# Patient Record
Sex: Female | Born: 1975 | Race: Black or African American | Hispanic: No | Marital: Married | Smoking: Never smoker
Health system: Southern US, Community
[De-identification: ages and names within clinical notes are randomized; demographics above are authoritative.]

## PROBLEM LIST (undated history)

## (undated) ENCOUNTER — Inpatient Hospital Stay (HOSPITAL_COMMUNITY): Payer: Self-pay

## (undated) DIAGNOSIS — D759 Disease of blood and blood-forming organs, unspecified: Secondary | ICD-10-CM

## (undated) DIAGNOSIS — D65 Disseminated intravascular coagulation [defibrination syndrome]: Secondary | ICD-10-CM

## (undated) DIAGNOSIS — Z789 Other specified health status: Secondary | ICD-10-CM

## (undated) DIAGNOSIS — G510 Bell's palsy: Secondary | ICD-10-CM

## (undated) HISTORY — PX: TONSILLECTOMY: SUR1361

## (undated) HISTORY — PX: DILATION AND CURETTAGE OF UTERUS: SHX78

## (undated) HISTORY — PX: WISDOM TOOTH EXTRACTION: SHX21

---

## 1998-11-21 ENCOUNTER — Ambulatory Visit (HOSPITAL_COMMUNITY): Admission: RE | Admit: 1998-11-21 | Discharge: 1998-11-21 | Payer: Self-pay | Admitting: Obstetrics

## 1999-01-02 ENCOUNTER — Inpatient Hospital Stay (HOSPITAL_COMMUNITY): Admission: RE | Admit: 1999-01-02 | Discharge: 1999-01-02 | Payer: Self-pay | Admitting: Obstetrics

## 1999-01-02 ENCOUNTER — Encounter: Payer: Self-pay | Admitting: Obstetrics

## 1999-02-27 ENCOUNTER — Inpatient Hospital Stay (HOSPITAL_COMMUNITY): Admission: AD | Admit: 1999-02-27 | Discharge: 1999-02-27 | Payer: Self-pay | Admitting: Obstetrics

## 1999-05-09 ENCOUNTER — Inpatient Hospital Stay (HOSPITAL_COMMUNITY): Admission: AD | Admit: 1999-05-09 | Discharge: 1999-05-13 | Payer: Self-pay | Admitting: Obstetrics

## 2001-06-04 ENCOUNTER — Inpatient Hospital Stay (HOSPITAL_COMMUNITY): Admission: AD | Admit: 2001-06-04 | Discharge: 2001-06-04 | Payer: Self-pay | Admitting: Obstetrics and Gynecology

## 2001-06-04 ENCOUNTER — Encounter: Payer: Self-pay | Admitting: Obstetrics and Gynecology

## 2001-11-10 ENCOUNTER — Inpatient Hospital Stay (HOSPITAL_COMMUNITY): Admission: AD | Admit: 2001-11-10 | Discharge: 2001-11-10 | Payer: Self-pay | Admitting: Obstetrics

## 2002-01-28 ENCOUNTER — Inpatient Hospital Stay (HOSPITAL_COMMUNITY): Admission: AD | Admit: 2002-01-28 | Discharge: 2002-01-31 | Payer: Self-pay | Admitting: Obstetrics

## 2002-04-09 ENCOUNTER — Encounter: Admission: RE | Admit: 2002-04-09 | Discharge: 2002-05-09 | Payer: Self-pay | Admitting: Obstetrics

## 2012-07-22 ENCOUNTER — Other Ambulatory Visit: Payer: Self-pay | Admitting: Gastroenterology

## 2012-07-22 DIAGNOSIS — R1310 Dysphagia, unspecified: Secondary | ICD-10-CM

## 2012-07-30 ENCOUNTER — Ambulatory Visit
Admission: RE | Admit: 2012-07-30 | Discharge: 2012-07-30 | Disposition: A | Discharge status: Home / Self Care | Payer: 59 | Source: Ambulatory Visit | Attending: Gastroenterology | Admitting: Gastroenterology

## 2012-07-30 DIAGNOSIS — R1310 Dysphagia, unspecified: Secondary | ICD-10-CM

## 2012-07-30 IMAGING — RF DG ESOPHAGUS
15 of 17 series · 20 of 23 positions shown · non-contrast
Comparison: None.

CLINICAL DATA: Difficulty swallowing pills.

ESOPHOGRAM/BARIUM SWALLOW
TECHNIQUE: Combined double contrast and single contrast
examination performed using effervescent crystals, thick barium
liquid, and thin barium liquid.
Fluoroscopy time:  1.9 minutes.

[Series 1: run · 3 of 4 slices shown (1 of 15)]
[im 1/4]
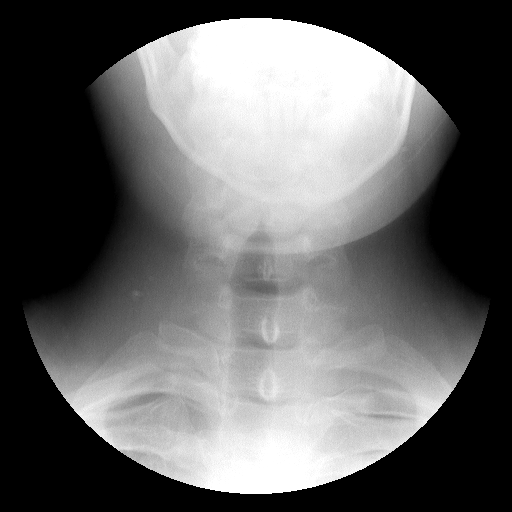
[im 2/4]
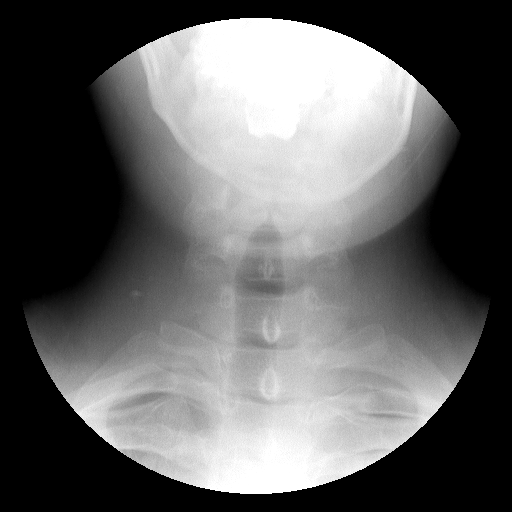
[im 3/4]
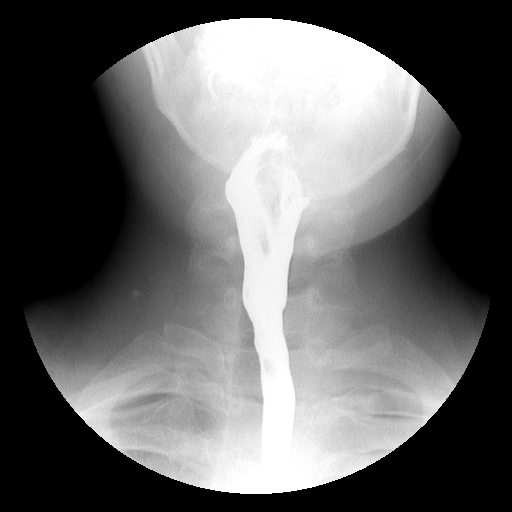

[Series 2: run · 1 of 1 slices shown (2 of 15)]
[im 1/1]
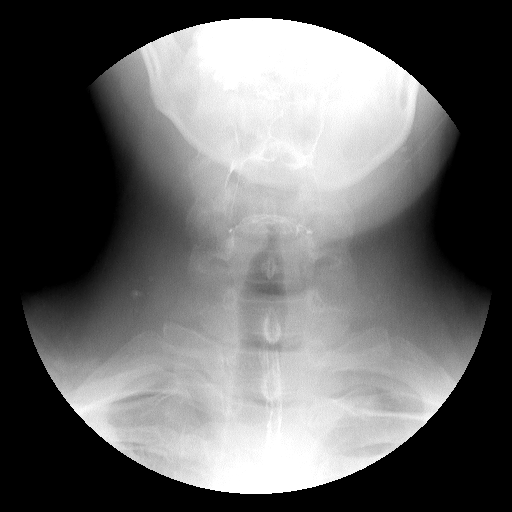

[Series 3: run · 4 of 4 slices shown (3 of 15)]
[im 1/4]
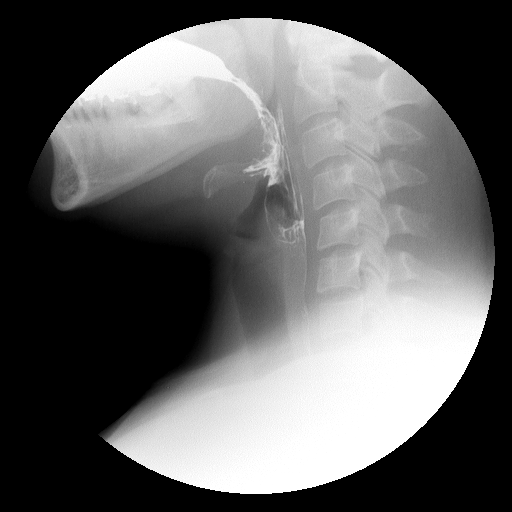
[im 2/4]
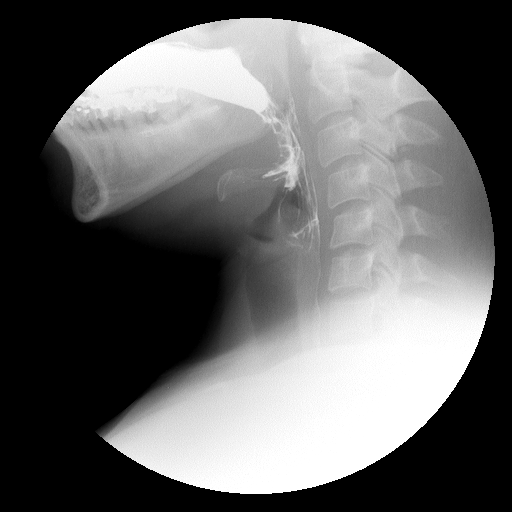
[im 3/4]
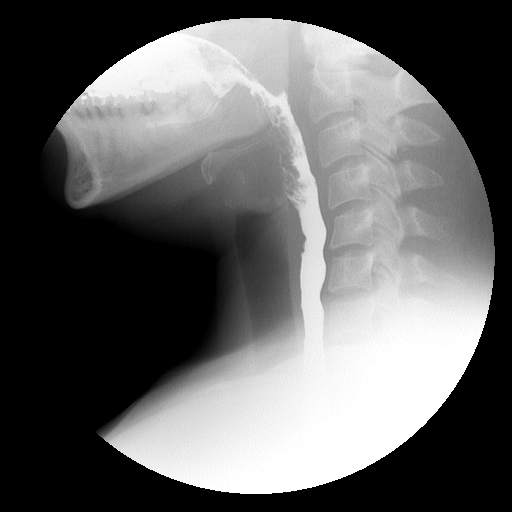
[im 4/4]
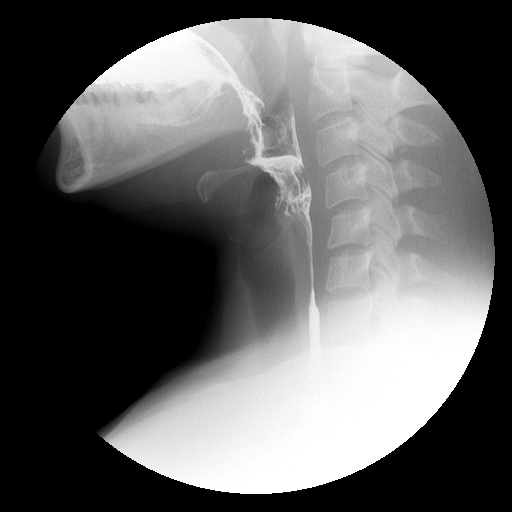

[Series 4: run · 1 of 1 slices shown (4 of 15)]
[im 1/1]
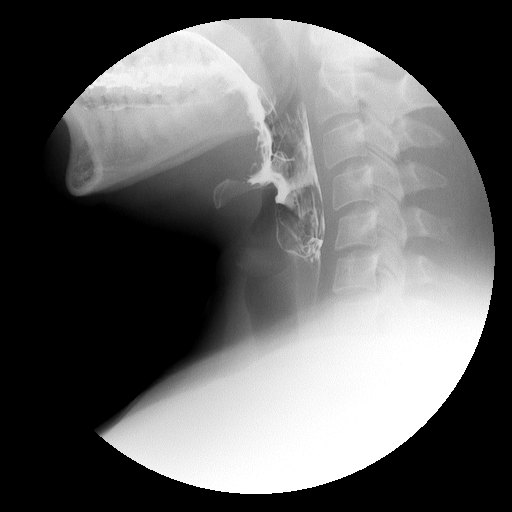

[Series 5: run · 1 of 1 slices shown (5 of 15)]
[im 1/1]
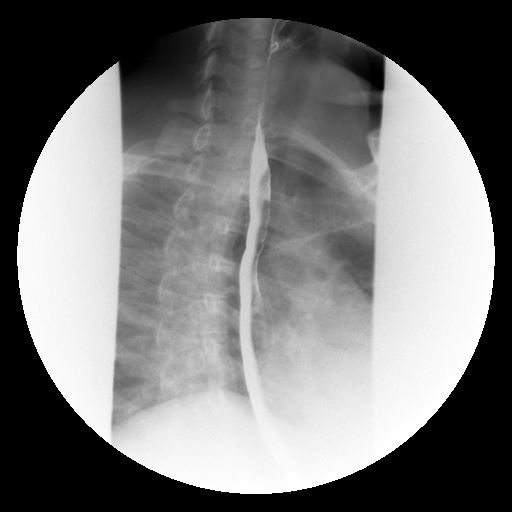

[Series 7: run · 1 of 1 slices shown (6 of 15)]
[im 1/1]
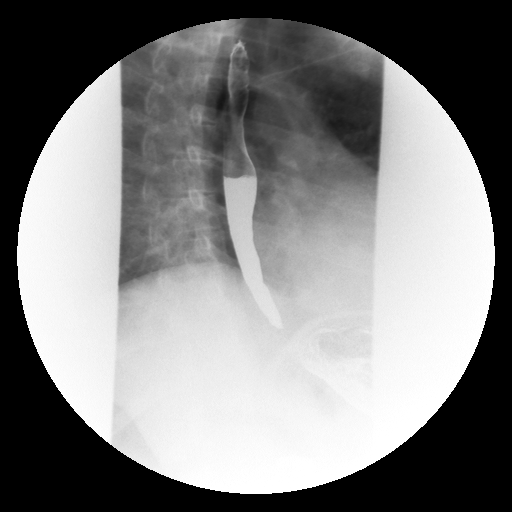

[Series 8: run · 1 of 1 slices shown (7 of 15)]
[im 1/1]
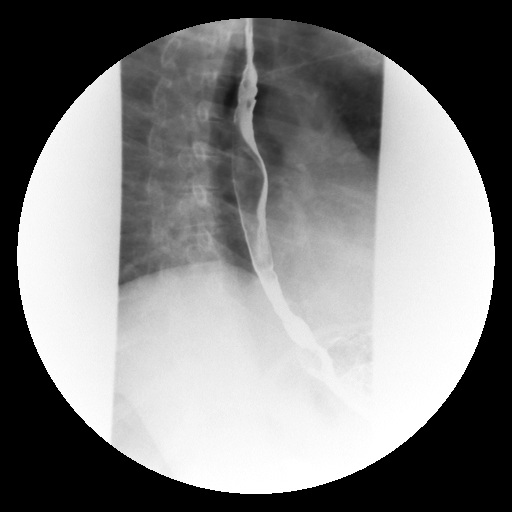

[Series 9: run · 1 of 1 slices shown (8 of 15)]
[im 1/1]
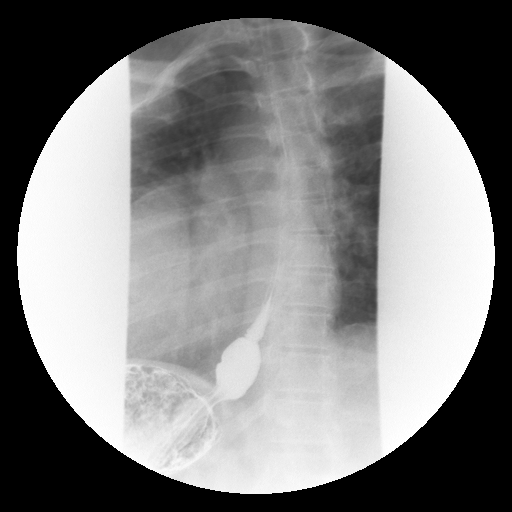

[Series 10: run · 1 of 1 slices shown (9 of 15)]
[im 1/1]
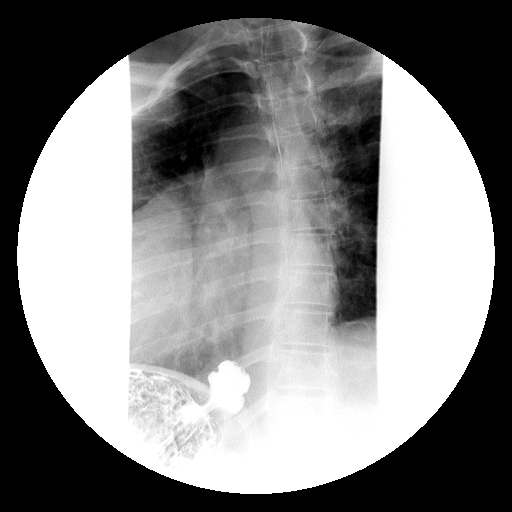

[Series 11: run · 1 of 1 slices shown (10 of 15)]
[im 1/1]
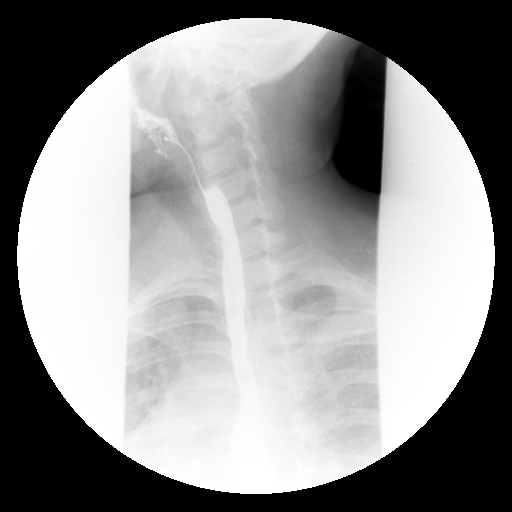

[Series 12: run · 1 of 1 slices shown (11 of 15)]
[im 1/1]
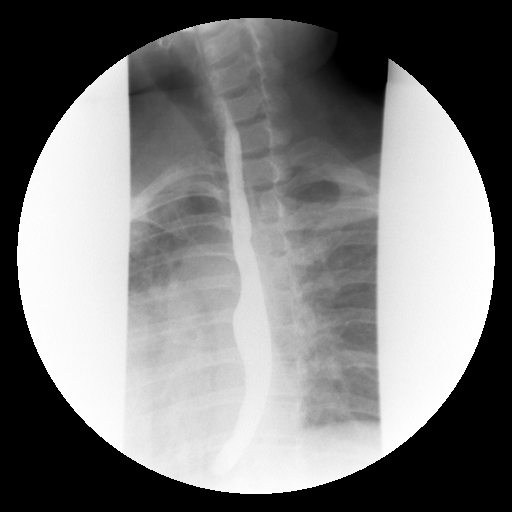

[Series 13: run · 1 of 1 slices shown (12 of 15)]
[im 1/1]
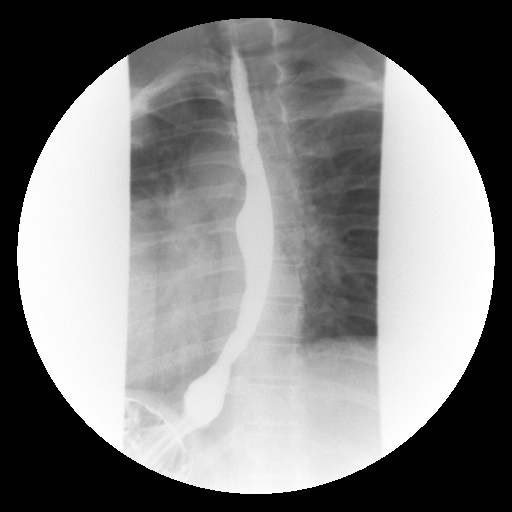

[Series 15: run · 1 of 1 slices shown (13 of 15)]
[im 1/1]
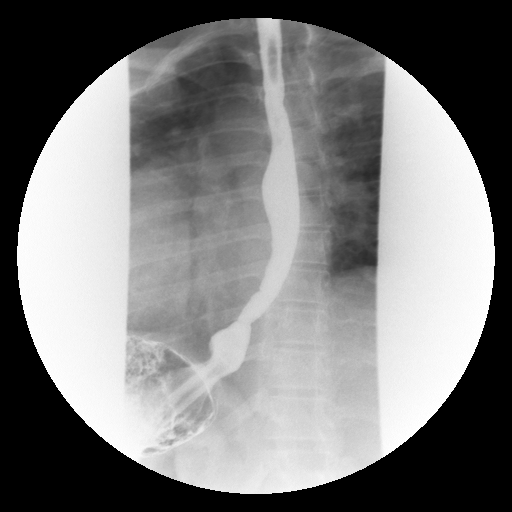

[Series 16: run · 1 of 1 slices shown (14 of 15)]
[im 1/1]
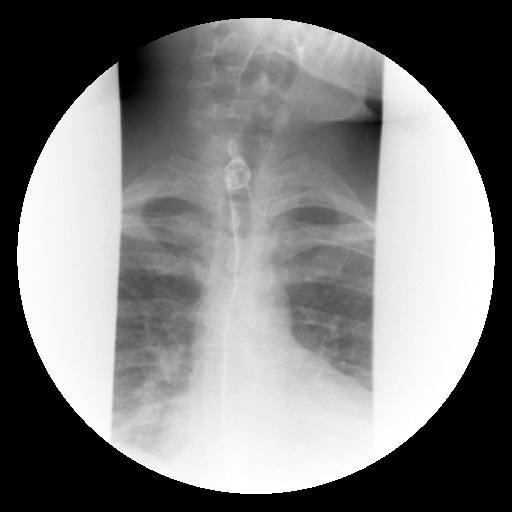

[Series 17: run · 1 of 1 slices shown (15 of 15)]
[im 1/1]
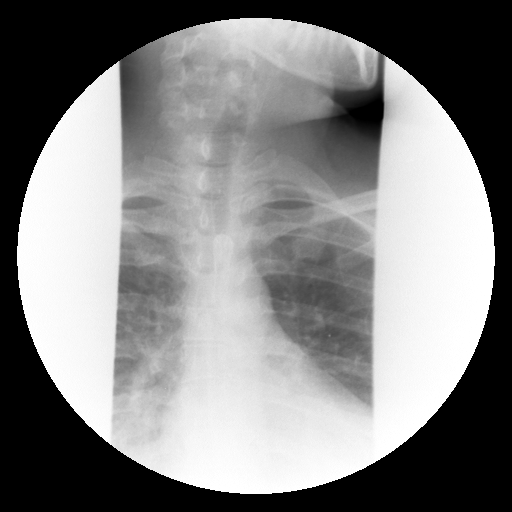

[20 of 23 positions shown; findings below may reference images not displayed]

FINDINGS: Swallowing mechanism is normal.  No discrete area of
stricturing in the esophagus.  However, a 13 mm barium pill would
not pass beyond the upper esophagus.  Esophageal motility is
normal.  Small hiatal hernia.
IMPRESSION: A 13 mm barium pill would not pass beyond the proximal esophagus.
The possibility of subtle short segment narrowing is considered.

## 2012-08-13 ENCOUNTER — Encounter (HOSPITAL_COMMUNITY)
Admission: RE | Disposition: A | Discharge status: Home / Self Care | Payer: Self-pay | Source: Ambulatory Visit | Attending: Gastroenterology

## 2012-08-13 ENCOUNTER — Ambulatory Visit (HOSPITAL_COMMUNITY)
Admission: RE | Admit: 2012-08-13 | Discharge: 2012-08-13 | Disposition: A | Discharge status: Home / Self Care | Payer: 59 | Source: Ambulatory Visit | Attending: Gastroenterology | Admitting: Gastroenterology

## 2012-08-13 ENCOUNTER — Encounter (HOSPITAL_COMMUNITY): Payer: Self-pay | Admitting: *Deleted

## 2012-08-13 DIAGNOSIS — K222 Esophageal obstruction: Secondary | ICD-10-CM | POA: Insufficient documentation

## 2012-08-13 DIAGNOSIS — IMO0002 Reserved for concepts with insufficient information to code with codable children: Secondary | ICD-10-CM | POA: Insufficient documentation

## 2012-08-13 DIAGNOSIS — S1120XA Unspecified open wound of pharynx and cervical esophagus, initial encounter: Secondary | ICD-10-CM | POA: Insufficient documentation

## 2012-08-13 HISTORY — PX: ESOPHAGOGASTRODUODENOSCOPY: SHX5428

## 2012-08-13 HISTORY — PX: SAVORY DILATION: SHX5439

## 2012-08-13 HISTORY — DX: Bell's palsy: G51.0

## 2012-08-13 SURGERY — EGD (ESOPHAGOGASTRODUODENOSCOPY)
Anesthesia: Moderate Sedation

## 2012-08-13 MED ORDER — MIDAZOLAM HCL 10 MG/2ML IJ SOLN
INTRAMUSCULAR | Status: AC
  Filled 2012-08-13: qty 2

## 2012-08-13 MED ORDER — FENTANYL CITRATE 0.05 MG/ML IJ SOLN
INTRAMUSCULAR | Status: DC | PRN
  Administered 2012-08-13: 25 ug via INTRAVENOUS
  Administered 2012-08-13: 12.5 ug via INTRAVENOUS
  Administered 2012-08-13: 25 ug via INTRAVENOUS

## 2012-08-13 MED ORDER — FENTANYL CITRATE 0.05 MG/ML IJ SOLN
INTRAMUSCULAR | Status: AC
  Filled 2012-08-13: qty 2

## 2012-08-13 MED ORDER — MIDAZOLAM HCL 10 MG/2ML IJ SOLN
INTRAMUSCULAR | Status: DC | PRN
  Administered 2012-08-13: 1 mg via INTRAVENOUS
  Administered 2012-08-13 (×2): 2 mg via INTRAVENOUS
  Administered 2012-08-13: 1 mg via INTRAVENOUS
  Administered 2012-08-13: 2 mg via INTRAVENOUS

## 2012-08-13 MED ORDER — BUTAMBEN-TETRACAINE-BENZOCAINE 2-2-14 % EX AERO
INHALATION_SPRAY | CUTANEOUS | Status: DC | PRN
  Administered 2012-08-13: 2 via TOPICAL

## 2012-08-13 MED ORDER — SODIUM CHLORIDE 0.9 % IV SOLN: INTRAVENOUS | Status: DC

## 2012-08-13 MED ORDER — DIPHENHYDRAMINE HCL 50 MG/ML IJ SOLN
INTRAMUSCULAR | Status: AC
  Filled 2012-08-13: qty 1

## 2012-08-13 NOTE — Op Note (Signed)
[facility] [address] [place], [address]   ENDOSCOPY PROCEDURE REPORT  PATIENT: Monica, Yu  MR#: [id number] BIRTHDATE: 14-Jan-1976 , 36  yrs. old GENDER: Female ENDOSCOPIST: Wandalee Ferdinand, MD REFERRED BY: PROCEDURE DATE:  08/13/2012 PROCEDURE:   esophagoscopy ASA CLASS: 1 INDICATIONS: dysphagia, barium swallow showed that a barium tablet lodged in the proximal esophagus suggesting a stricture in this area, there was also a hiatal hernia MEDICATIONS: fentanyl 62.5 mcg IV, Versed 8 mg IV TOPICAL ANESTHETIC:Cetacaine spray  informed consent was obtained after explanation of the risks of bleeding, infection, and perforation.  Procedure: With the patient in the left lateral decubitus position the Pentax gastroscope was inserted into the oropharynx and passed into the esophagus. There was some mild initial resistance to the passage of the scope into the proximal esophagus suggesting that there might have been a esophageal web in this area (see image #1). The scope was then advanced down the esophagus without difficulty to the region of the esophagogastric junction at which point a stricture was seen. The scope could not be advanced past this distal esophageal stricture. (See image #2) I then pulled the scope back a few centimeters and noted that there was a mucosal tear in the distal esophagus a few centimeters above the distal esophageal stricture. (See image #5) this tear occurred simply by advancing the scope down the esophagus without any resistance. The scope was then brought back again into the mid esophagus where there was one episode where I noted concentric rings with contraction of the esophagus suggesting that perhaps the patient has eosinophilic esophagitis. (see image #7) Biopsies were obtained from the esophagus to determine if she had eosinophilic esophagitis. No esophageal dilatation was performed at this time given the possibility of  eosinophilic esophagitis.         DESCRIPTION OF PROCEDURE:   After the risks benefits and alternatives of the procedure were thoroughly explained ,including the risks of bleeding, infection, and perforation, informed consent was obtained.  The [id number] ([id number])  endoscope was introduced through the mouth and advanced to the distal esophagus.      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  #2 distal esophageal stricture which was not traversed during this examination and not dilated. #3 distal esophageal mucosal tear which occurred simply with passage of the endoscope. #4 Concentric esophageal rings noted rule out eosinophilic esophagitis biopsies pending      COMPLICATIONS:distal esophageal mucosal tear occurred simply with passage of the endoscope this appears to be superficial  ENDOSCOPIC IMPRESSION:see above   RECOMMENDATIONS:check biopsies to determine if she has eosinophilic esophagitis and did so this will need to be treated prior to any attempts at esophageal dilatation.      _______________________________ Rhodia Albright, MD 08/13/2012 11:03 AM       PATIENT NAME:  Monica, Yu MR#: [id number]

## 2012-08-13 NOTE — H&P (Signed)
Interval H&P for outpatient endoscopic procedure  The patient is a 36 year old female with intermittent dysphagia to solid foods for the past 4 years. A recent barium swallow with a barium tablet revealed that the barium tablet would not pass out of the proximal esophagus, suggesting a small stricture in this area.  Physical exam:  She is in no acute distress  Heart regular rhythm no murmurs  Lungs clear  Abdomen: Soft and nontender  Impression: Dysphagia, rule out proximal esophageal stricture  Plan: EGD with esophageal dilatation

## 2012-08-14 ENCOUNTER — Encounter (HOSPITAL_COMMUNITY): Payer: Self-pay

## 2012-08-14 ENCOUNTER — Encounter (HOSPITAL_COMMUNITY): Payer: Self-pay | Admitting: Gastroenterology

## 2016-06-02 ENCOUNTER — Inpatient Hospital Stay (HOSPITAL_COMMUNITY)
Admission: AD | Admit: 2016-06-02 | Discharge: 2016-06-02 | Disposition: A | Discharge status: Home / Self Care | Payer: 59 | Source: Ambulatory Visit | Attending: Obstetrics & Gynecology | Admitting: Obstetrics & Gynecology

## 2016-06-02 ENCOUNTER — Encounter (HOSPITAL_COMMUNITY): Payer: Self-pay | Admitting: *Deleted

## 2016-06-02 ENCOUNTER — Inpatient Hospital Stay (HOSPITAL_COMMUNITY): Payer: 59

## 2016-06-02 DIAGNOSIS — R109 Unspecified abdominal pain: Secondary | ICD-10-CM | POA: Insufficient documentation

## 2016-06-02 DIAGNOSIS — O2342 Unspecified infection of urinary tract in pregnancy, second trimester: Secondary | ICD-10-CM

## 2016-06-02 DIAGNOSIS — O34219 Maternal care for unspecified type scar from previous cesarean delivery: Secondary | ICD-10-CM | POA: Diagnosis not present

## 2016-06-02 DIAGNOSIS — O3412 Maternal care for benign tumor of corpus uteri, second trimester: Secondary | ICD-10-CM

## 2016-06-02 DIAGNOSIS — IMO0001 Reserved for inherently not codable concepts without codable children: Secondary | ICD-10-CM

## 2016-06-02 DIAGNOSIS — Z3A21 21 weeks gestation of pregnancy: Secondary | ICD-10-CM | POA: Diagnosis not present

## 2016-06-02 DIAGNOSIS — O26892 Other specified pregnancy related conditions, second trimester: Secondary | ICD-10-CM | POA: Diagnosis not present

## 2016-06-02 DIAGNOSIS — D259 Leiomyoma of uterus, unspecified: Secondary | ICD-10-CM | POA: Diagnosis not present

## 2016-06-02 DIAGNOSIS — O3410 Maternal care for benign tumor of corpus uteri, unspecified trimester: Secondary | ICD-10-CM

## 2016-06-02 HISTORY — DX: Other specified health status: Z78.9

## 2016-06-02 LAB — URINALYSIS, ROUTINE W REFLEX MICROSCOPIC
BILIRUBIN URINE: NEGATIVE
Glucose, UA: NEGATIVE mg/dL
Ketones, ur: 40 mg/dL — AB
Nitrite: POSITIVE — AB
PH: 5.5 (ref 5.0–8.0)
Protein, ur: NEGATIVE mg/dL
SPECIFIC GRAVITY, URINE: 1.025 (ref 1.005–1.030)

## 2016-06-02 LAB — RAPID URINE DRUG SCREEN, HOSP PERFORMED
Amphetamines: NOT DETECTED
BENZODIAZEPINES: NOT DETECTED
Barbiturates: NOT DETECTED
COCAINE: NOT DETECTED
Opiates: NOT DETECTED
Tetrahydrocannabinol: NOT DETECTED

## 2016-06-02 LAB — WET PREP, GENITAL
CLUE CELLS WET PREP: NONE SEEN
SPERM: NONE SEEN
Trich, Wet Prep: NONE SEEN
YEAST WET PREP: NONE SEEN

## 2016-06-02 LAB — URINE MICROSCOPIC-ADD ON

## 2016-06-02 LAB — FETAL FIBRONECTIN: FETAL FIBRONECTIN: POSITIVE — AB

## 2016-06-02 IMAGING — US US MFM OB COMP +14 WKS
1 series · 13 of 28 positions shown · non-contrast
Comparison: none

[Series 1: us mfm ob comp +14 wks · 13 of 84 slices shown]
[im 4/84]
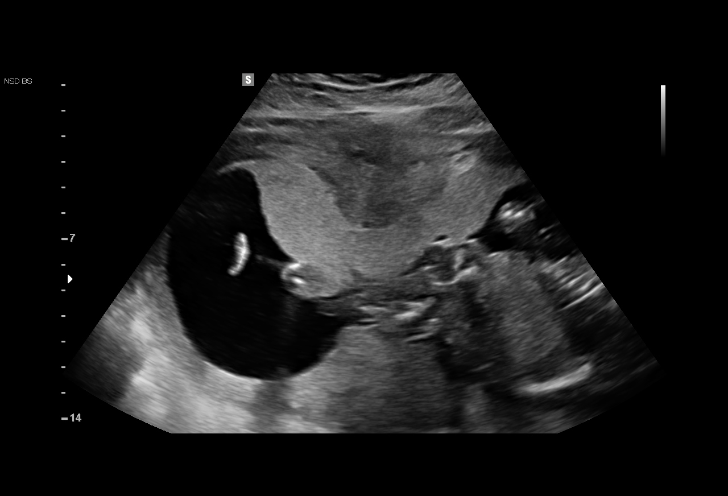
[im 10/84]
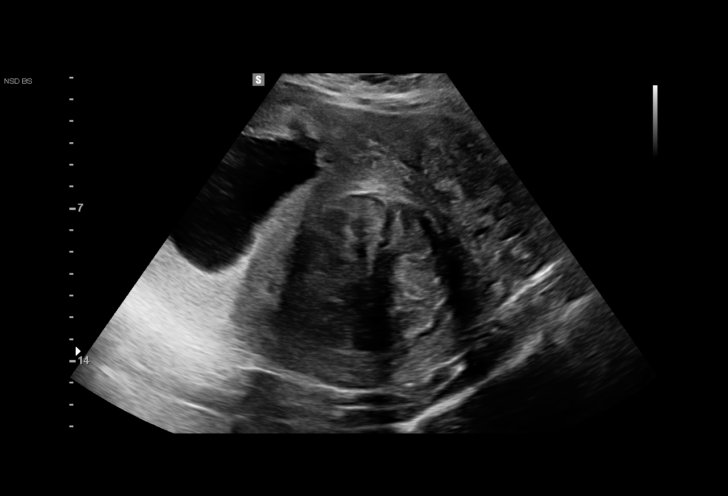
[im 16/84]
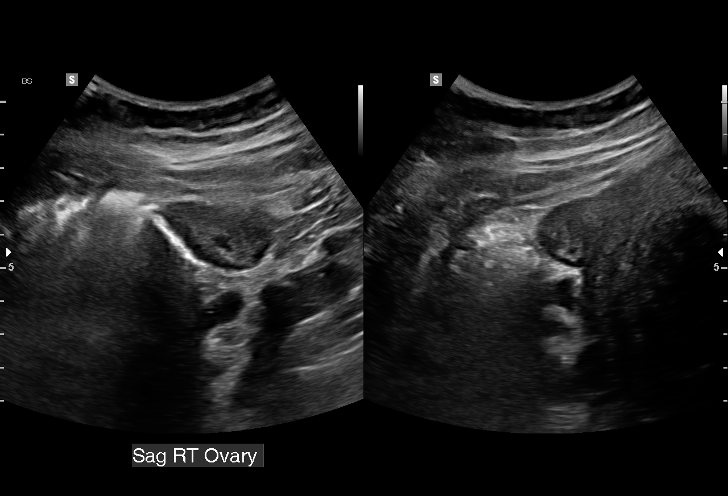
[im 22/84]
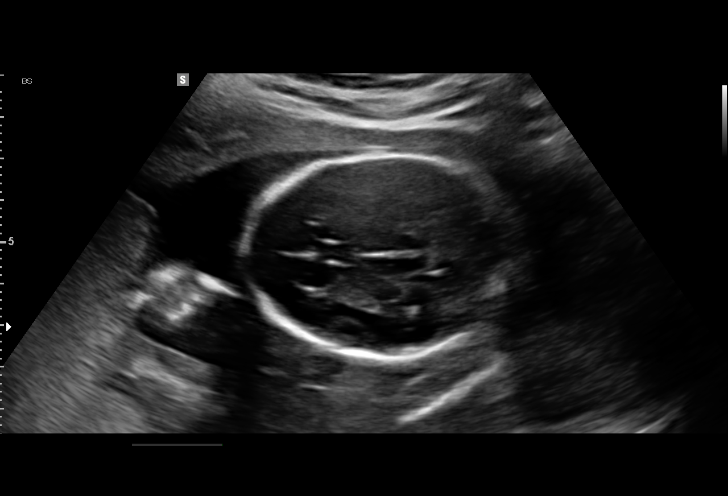
[im 28/84]
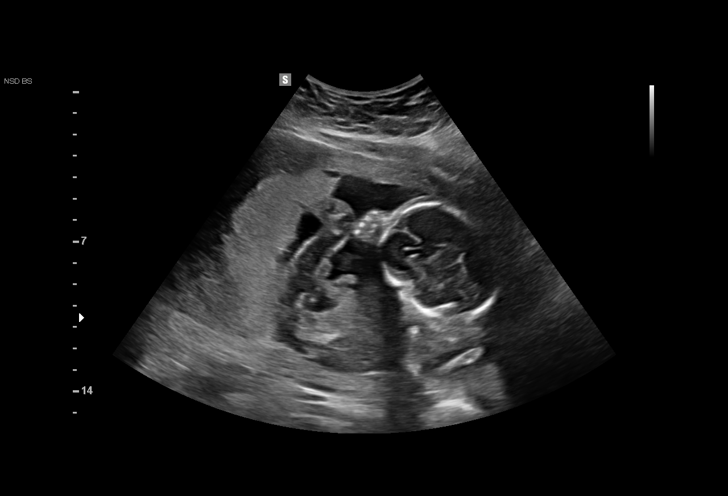
[im 34/84]
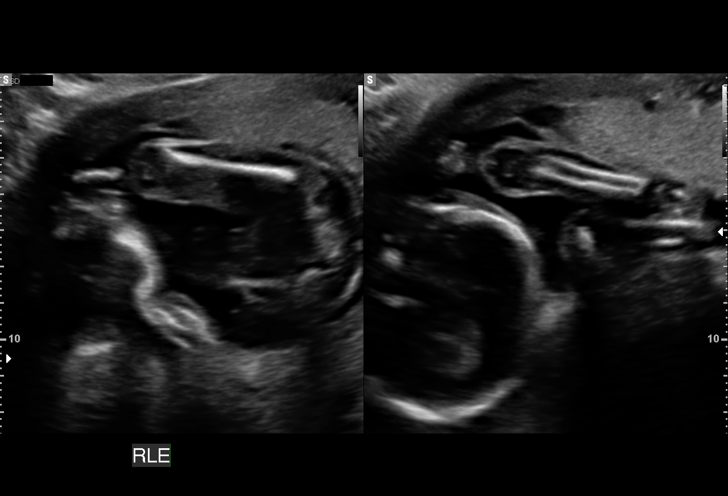
[im 44/84]
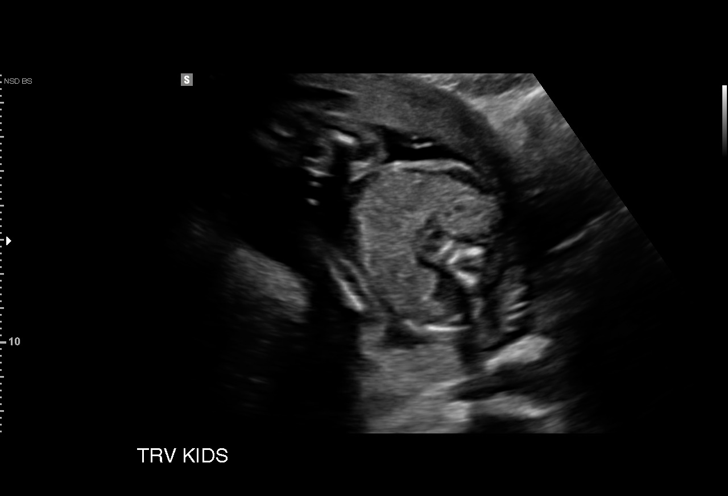
[im 50/84]
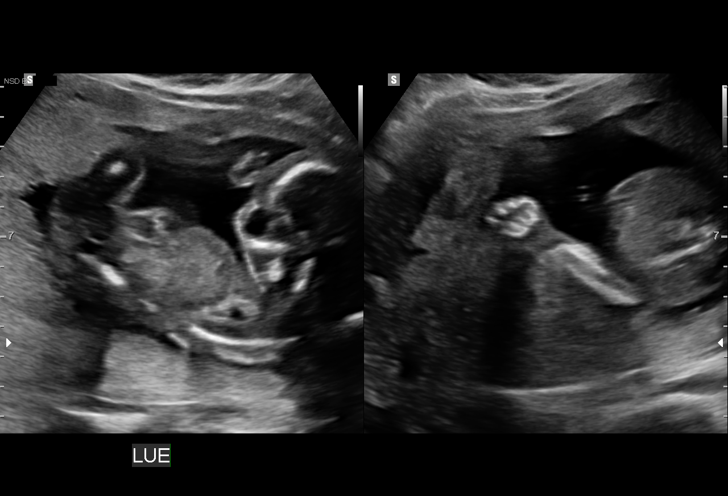
[im 56/84]
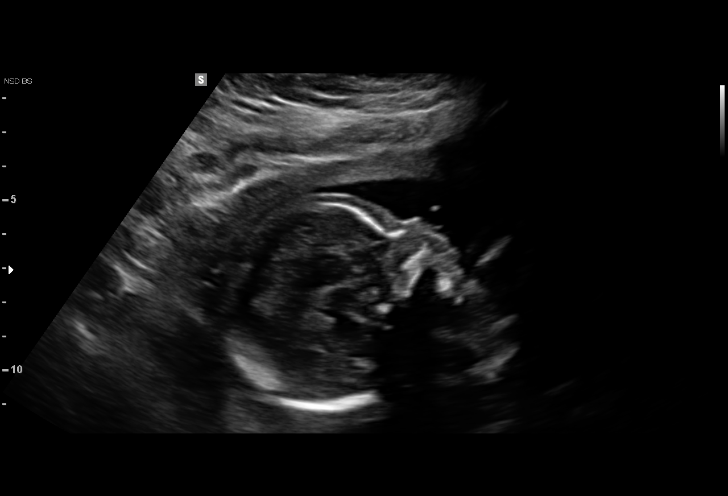
[im 62/84]
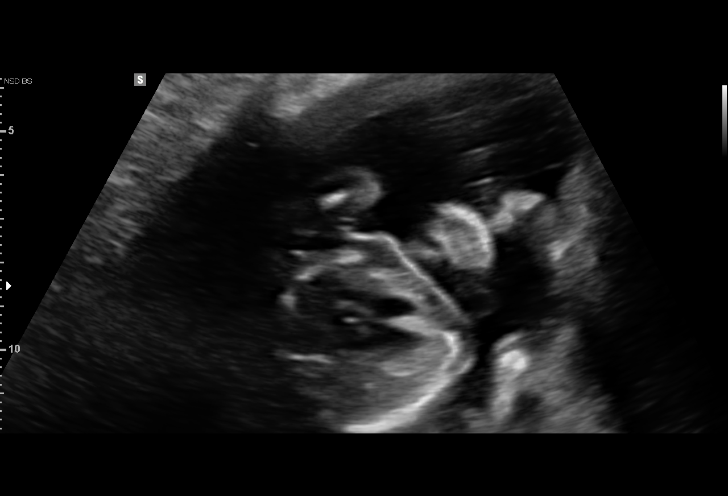
[im 68/84]
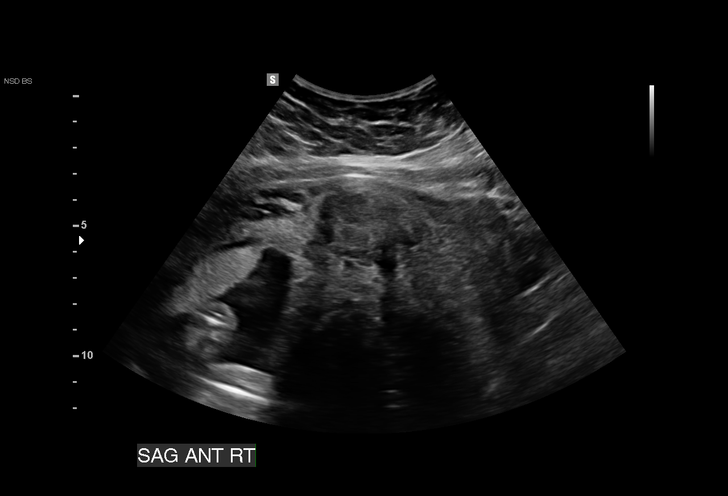
[im 74/84]
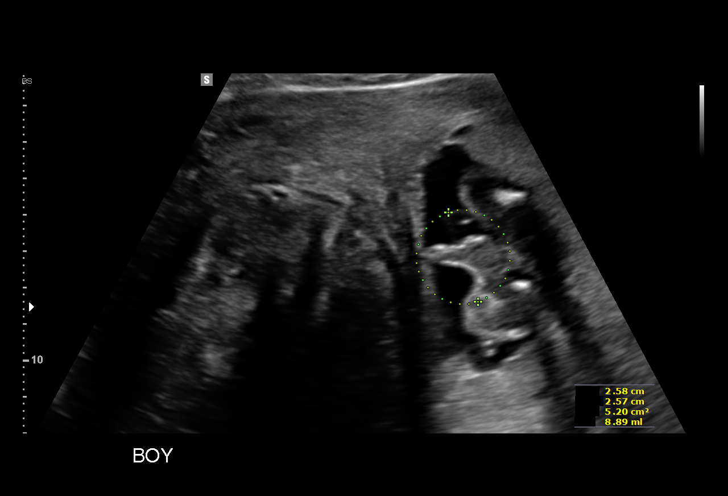
[im 80/84]
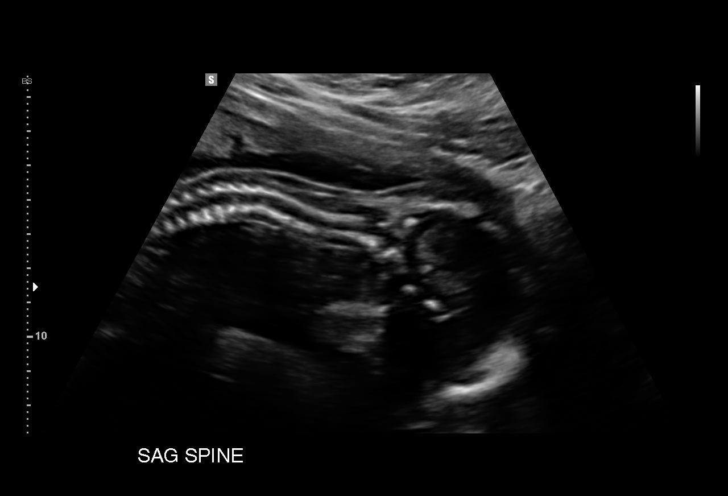

[13 of 28 positions shown; findings below may reference images not displayed]

MAU/Triage

1  DANII AUJLA             [id number]       [id number]     [id number]
Indications

21 weeks gestation of pregnancy
Insufficient Prenatal Care (No PNC)
Uncertain LMP,  Establish Gestational Age      Z36
Advanced maternal age multigravida 35+,
second trimester (40 y.o.)
Previous cesarean delivery x 3
Uterine fibroids affecting pregnancy in        O34.12,
second trimester, antepartum
Abdominal pain in pregnancy
OB History

Gravidity:    5         Term:   3        Prem:   0        SAB:   1
TOP:          0       Ectopic:  0        Living: 3
Fetal Evaluation

Num Of Fetuses:     1
Fetal Heart         143
Rate(bpm):
Cardiac Activity:   Observed
Presentation:       Cephalic
Placenta:           Left lateral, above cervical os
P. Cord Insertion:  Visualized

Amniotic Fluid
AFI FV:      Subjectively within normal limits

Largest Pocket(cm)
7.9
Biometry

BPD:      48.6  mm     G. Age:  20w 5d         36  %    CI:        72.67   %    70 - 86
FL/HC:       19.4  %    15.9 -
HC:      181.3  mm     G. Age:  20w 4d         22  %    HC/AC:       1.08       1.06 -
AC:       168   mm     G. Age:  21w 6d         69  %    FL/BPD:      72.2  %
FL:       35.1  mm     G. Age:  21w 0d         44  %    FL/AC:       20.9  %    20 - 24
HUM:      35.6  mm     G. Age:  22w 3d         79  %
CER:      22.2  mm     G. Age:  21w 1d         53  %

Est. FW:     418   gm   0 lb 15 oz      51  %
Gestational Age

LMP:           19w 4d        Date:  01/17/16                 EDD:   10/23/16
U/S Today:     21w 0d                                        EDD:   10/13/16
Best:          21w 0d     Det. By:  U/S (06/02/16)           EDD:   10/13/16
Anatomy

Cranium:               Appears normal         Aortic Arch:            Not well visualized
Cavum:                 Appears normal         Ductal Arch:            Not well visualized
Ventricles:            Appears normal         Diaphragm:              Appears normal
Choroid Plexus:        Appears normal         Stomach:                Appears normal, left
sided
Cerebellum:            Appears normal         Abdomen:                Appears normal
Posterior Fossa:       Not well visualized    Abdominal Wall:         Not well visualized
Nuchal Fold:           Not applicable (>20    Cord Vessels:           Appears normal (3
wks GA)                                        vessel cord)
Face:                  Orbits appear          Kidneys:                Appear normal
normal
Lips:                  Appears normal         Bladder:                Appears normal
Palate:                Not well visualized    Spine:                  Limited views
appear normal
Heart:                 Appears normal         Upper Extremities:      Visualized
(4CH, axis, and situs
RVOT:                  Not well visualized    Lower Extremities:      Visualized
LVOT:                  Not well visualized

Other:  Fetus appears to be a male. 5th digit visualized. Open hands
visualized. Technically difficult due to maternal habitus and fetal
position.
Cervix Uterus Adnexa

Cervix
Length:            3.5  cm.
Normal appearance by transabdominal scan.

Uterus
Multiple fibroids noted, see table below.

Left Ovary
Not visualized.

Right Ovary
Within normal limits.

Adnexa:       No abnormality visualized.
Myomas

Site                     L(cm)      W(cm)      D(cm)       Location
Right                    9.4        8
Anterior Rt
Blood Flow                 RI        PI       Comments

Impression

Singleton intrauterine pregnancy at 21 weeks 0 days
gestation with fetal cardiac activity
Cephalic presentation
No evidence of placenta previa
Normal appearing fetal growth and amniotic fluid volume
No evidence of birth defects noted on fetal anatomic survey
although not all structures well visualized on ultrasound today
secondary to maternal habitus and fetal position
Normal appearing cervical length
Multiple fibroids as noted above
Recommendations

Recommend follow-up ultrasound examination for completion
of fetal anatomic survey in 
3-4 weeks

## 2016-06-02 MED ORDER — PHENAZOPYRIDINE HCL 100 MG PO TABS
200.0000 mg | ORAL_TABLET | Freq: Once | ORAL | Status: AC
  Administered 2016-06-02: 200 mg via ORAL
  Filled 2016-06-02: qty 2

## 2016-06-02 MED ORDER — PHENAZOPYRIDINE HCL 200 MG PO TABS: 200.0000 mg | ORAL_TABLET | Freq: Three times a day (TID) | ORAL | 0 refills | Status: AC | PRN

## 2016-06-02 MED ORDER — TERBUTALINE SULFATE 1 MG/ML IJ SOLN: 0.2500 mg | Freq: Once | INTRAMUSCULAR | Status: DC

## 2016-06-02 MED ORDER — LACTATED RINGERS IV SOLN
INTRAVENOUS | Status: DC
  Administered 2016-06-02: 09:00:00 via INTRAVENOUS

## 2016-06-02 MED ORDER — NITROFURANTOIN 25 MG/5ML PO SUSP: 100.0000 mg | Freq: Two times a day (BID) | ORAL | 0 refills | Status: AC

## 2016-06-02 MED ORDER — NITROFURANTOIN MONOHYD MACRO 100 MG PO CAPS
100.0000 mg | ORAL_CAPSULE | Freq: Once | ORAL | Status: AC
  Administered 2016-06-02: 100 mg via ORAL
  Filled 2016-06-02: qty 1

## 2016-06-02 NOTE — MAU Note (Signed)
Pain every 2-3 mins all day Friday. Since 2300 pain has settled to set pattern. Denies LOF or bleeding. No PNC. LMP around March 21.

## 2016-06-02 NOTE — Progress Notes (Signed)
Transducer adj. Pt on L side. Baby active and difficult to monitor

## 2016-06-02 NOTE — MAU Provider Note (Signed)
History     CSN: [id number]  Arrival date and time: 06/02/16 [id number]     Chief Complaint  Patient presents with  . Abdominal Pain   HPI  Pt is a 40yo [id number] with unknown gestational age and h/o C/S x3 who is presenting with abdominal pain that is occurring every 2-3 min. Pain started aprox 7 hours ago. Denies vaginal fluid or bleeding. LMP around March 21 but not sure. No prenatal care.      OB History    Gravida Para Term Preterm AB Living   5 3 3   1 3    SAB TAB Ectopic Multiple Live Births   1       3      Past Medical History:  Diagnosis Date  . Bell's palsy    1997  . Medical history non-contributory     Past Surgical History:  Procedure Laterality Date  . CESAREAN SECTION     [phone]  . ESOPHAGOGASTRODUODENOSCOPY  08/13/2012   Procedure: ESOPHAGOGASTRODUODENOSCOPY (EGD);  Surgeon: Wonda Horner, MD;  Location: [facility];  Service: Endoscopy;  Laterality: N/A;  need xray  . SAVORY DILATION  08/13/2012   Procedure: SAVORY DILATION;  Surgeon: Wonda Horner, MD;  Location: [facility];  Service: Endoscopy;  Laterality: N/A;  . TONSILLECTOMY     1995    History reviewed. No pertinent family history.  Social History  Substance Use Topics  . Smoking status: Never Smoker  . Smokeless tobacco: Never Used  . Alcohol use Yes     Comment: 2drinks weekly    Allergies: No Known Allergies  Prescriptions Prior to Admission  Medication Sig Dispense Refill Last Dose  . Aspirin-Salicylamide-Caffeine (BC HEADACHE POWDER PO) Take by mouth.   Past Week at Unknown time    Review of Systems  Constitutional: Negative for chills and fever.  Eyes: Negative for blurred vision.  Respiratory: Negative for cough.   Cardiovascular: Negative for chest pain and palpitations.  Gastrointestinal: Negative.   Genitourinary: Negative for dysuria, frequency and urgency.  Neurological: Negative for headaches.   Physical Exam   Blood pressure 126/71, pulse 91,  temperature 98.5 F (36.9 C), resp. rate 18, height 5\' 2"  (1.575 m), weight 224 lb 3.2 oz (101.7 kg), last menstrual period 01/17/2016.  Physical Exam  Constitutional: She is oriented to person, place, and time. She appears well-developed and well-nourished.  HENT:  Head: Normocephalic and atraumatic.  Neck: Normal range of motion.  Cardiovascular: Normal rate, regular rhythm and normal heart sounds.   Respiratory: Effort normal and breath sounds normal.  GI: She exhibits distension. There is no tenderness. There is no rebound.  Genitourinary: Vagina normal.  Neurological: She is alert and oriented to person, place, and time.  Skin: Skin is warm and dry.    MAU Course  Procedures & Labs  Cervical exam -closed & posterior  [place] MFM OB COMP +14 Wk 06/02/2016 -Cervical length 3.5 cm -Gestational age [redacted]w[redacted]d -Normal female anatomy -Multiple large fibroids  Results for orders placed or performed during the hospital encounter of 06/02/16 (from the past 24 hour(s))  Urinalysis, Routine w reflex microscopic (not at [facility])     Status: Abnormal   Collection Time: 06/02/16  5:30 AM  Result Value Ref Range   Color, Urine YELLOW YELLOW   APPearance CLEAR CLEAR   Specific Gravity, Urine 1.025 1.005 - 1.030   pH 5.5 5.0 - 8.0   Glucose, UA NEGATIVE NEGATIVE mg/dL   Hgb urine dipstick TRACE (A)  NEGATIVE   Bilirubin Urine NEGATIVE NEGATIVE   Ketones, ur 40 (A) NEGATIVE mg/dL   Protein, ur NEGATIVE NEGATIVE mg/dL   Nitrite POSITIVE (A) NEGATIVE   Leukocytes, UA SMALL (A) NEGATIVE  Urine microscopic-add on     Status: Abnormal   Collection Time: 06/02/16  5:30 AM  Result Value Ref Range   Squamous Epithelial / LPF 6-30 (A) NONE SEEN   WBC, UA 6-30 0 - 5 WBC/hpf   RBC / HPF 0-5 0 - 5 RBC/hpf   Bacteria, UA MANY (A) NONE SEEN   Urine-Other MUCOUS PRESENT   Urine rapid drug screen (hosp performed)     Status: None   Collection Time: 06/02/16  5:30 AM  Result Value Ref Range   Opiates NONE  DETECTED NONE DETECTED   Cocaine NONE DETECTED NONE DETECTED   Benzodiazepines NONE DETECTED NONE DETECTED   Amphetamines NONE DETECTED NONE DETECTED   Tetrahydrocannabinol NONE DETECTED NONE DETECTED   Barbiturates NONE DETECTED NONE DETECTED  Fetal fibronectin     Status: Abnormal   Collection Time: 06/02/16  6:50 AM  Result Value Ref Range   Fetal Fibronectin POSITIVE (A) NEGATIVE    Assessment and Plan   Pt is a 40yo [id number] with estimated gestational age of [redacted]w[redacted]d by [place] and C/S x3 who is presenting with abdominal pain. Pain is NOT likely related to early labor given the closed cervix and findings from the [place]. Pain possibly related to bladder spasms affiliated with cystitis that is supported by urinalysis.  -IV LR 150/hr -nitrofurantoin 100mg  bid for 7 days -phenazopyridine 200mg  prn for urinary pain  Casimer Leek, MD, PGY-1, MPH 06/02/2016, 6:47 AM   I was present for the exam and agree with above. Fundal height 36 cm. Macrobid, IV bolus and pyridium given in MAU. FHR 150-160, mod variability, pos accels, unsure if having variables due to intermittent tracing (2/2 early GA). Overall reassuring for GA.  fFN positive, but not valid/possibly inaccurate due to early GA. Cervix long and closed by cervical exam and [place].  Given list of providers. Pregnancy Verification letter Push PO fluids.   [place], CNM 06/02/2016 11:19 AM

## 2016-06-02 NOTE — Discharge Instructions (Signed)
Pregnancy and Urinary Tract Infection °A urinary tract infection (UTI) is a bacterial infection of the urinary tract. Infection of the urinary tract can include the ureters, kidneys (pyelonephritis), bladder (cystitis), and urethra (urethritis). All pregnant women should be screened for bacteria in the urinary tract. Identifying and treating a UTI will decrease the risk of preterm labor and developing more serious infections in both the mother and baby. °CAUSES °Bacteria germs cause almost all UTIs.  °RISK FACTORS °Many factors can increase your chances of getting a UTI during pregnancy. These include: °· Having a short urethra. °· Poor toilet and hygiene habits. °· Sexual intercourse. °· Blockage of urine along the urinary tract. °· Problems with the pelvic muscles or nerves. °· Diabetes. °· Obesity. °· Bladder problems after having several children. °· Previous history of UTI. °SIGNS AND SYMPTOMS  °· Pain, burning, or a stinging feeling when urinating. °· Suddenly feeling the need to urinate right away (urgency). °· Loss of bladder control (urinary incontinence). °· Frequent urination, more than is common with pregnancy. °· Lower abdominal or back discomfort. °· Cloudy urine. °· Blood in the urine (hematuria). °· Fever.  °When the kidneys are infected, the symptoms may be: °· Back pain. °· Flank pain on the right side more so than the left. °· Fever. °· Chills. °· Nausea. °· Vomiting. °DIAGNOSIS  °A urinary tract infection is usually diagnosed through urine tests. Additional tests and procedures are sometimes done. These may include: °· Ultrasound exam of the kidneys, ureters, bladder, and urethra. °· Looking in the bladder with a lighted tube (cystoscopy). °TREATMENT °Typically, UTIs can be treated with antibiotic medicines.  °HOME CARE INSTRUCTIONS  °· Only take over-the-counter or prescription medicines as directed by your health care provider. If you were prescribed antibiotics, take them as directed. Finish  them even if you start to feel better. °· Drink enough fluids to keep your urine clear or pale yellow. °· Do not have sexual intercourse until the infection is gone and your health care provider says it is okay. °· Make sure you are tested for UTIs throughout your pregnancy. These infections often come back.  °Preventing a UTI in the Future °· Practice good toilet habits. Always wipe from front to back. Use the tissue only once. °· Do not hold your urine. Empty your bladder as soon as possible when the urge comes. °· Do not douche or use deodorant sprays. °· Wash with soap and warm water around the genital area and the anus. °· Empty your bladder before and after sexual intercourse. °· Wear underwear with a cotton crotch. °· Avoid caffeine and carbonated drinks. They can irritate the bladder. °· Drink cranberry juice or take cranberry pills. This may decrease the risk of getting a UTI. °· Do not drink alcohol. °· Keep all your appointments and tests as scheduled.  °SEEK MEDICAL CARE IF:  °· Your symptoms get worse. °· You are still having fevers 2 or more days after treatment begins. °· You have a rash. °· You feel that you are having problems with medicines prescribed. °· You have abnormal vaginal discharge. °SEEK IMMEDIATE MEDICAL CARE IF:  °· You have back or flank pain. °· You have chills. °· You have blood in your urine. °· You have nausea and vomiting. °· You have contractions of your uterus. °· You have a gush of fluid from the vagina. °MAKE SURE YOU: °· Understand these instructions.   °· Will watch your condition.   °· Will get help right away if you are not doing   well or get worse.    This information is not intended to replace advice given to you by your health care provider. Make sure you discuss any questions you have with your health care provider.   Document Released: 02/09/2011 Document Revised: 08/05/2013 Document Reviewed: 05/14/2013 Elsevier Interactive Patient Education 2016 Elsevier  Inc.   Uterine Fibroids Uterine fibroids are tissue masses (tumors) that can develop in the womb (uterus). They are also called leiomyomas. This type of tumor is not cancerous (benign) and does not spread to other parts of the body outside of the pelvic area, which is between the hip bones. Occasionally, fibroids may develop in the fallopian tubes, in the cervix, or on the support structures (ligaments) that surround the uterus. You can have one or many fibroids. Fibroids can vary in size, weight, and where they grow in the uterus. Some can become quite large. Most fibroids do not require medical treatment. CAUSES A fibroid can develop when a single uterine cell keeps growing (replicating). Most cells in the human body have a control mechanism that keeps them from replicating without control. SIGNS AND SYMPTOMS Symptoms may include:   Heavy bleeding during your period.  Bleeding or spotting between periods.  Pelvic pain and pressure.  Bladder problems, such as needing to urinate more often (urinary frequency) or urgently.  Inability to reproduce offspring (infertility).  Miscarriages. DIAGNOSIS Uterine fibroids are diagnosed through a physical exam. Your health care provider may feel the lumpy tumors during a pelvic exam. Ultrasonography and an MRI may be done to determine the size, location, and number of fibroids. TREATMENT Treatment may include:  Watchful waiting. This involves getting the fibroid checked by your health care provider to see if it grows or shrinks. Follow your health care provider's recommendations for how often to have this checked.  Hormone medicines. These can be taken by mouth or given through an intrauterine device (IUD).  Surgery.  Removing the fibroids (myomectomy) or the uterus (hysterectomy).  Removing blood supply to the fibroids (uterine artery embolization). If fibroids interfere with your fertility and you want to become pregnant, your health care  provider may recommend having the fibroids removed.  HOME CARE INSTRUCTIONS  Keep all follow-up visits as directed by your health care provider. This is important.  Take medicines only as directed by your health care provider.  If you were prescribed a hormone treatment, take the hormone medicines exactly as directed.  Do not take aspirin, because it can cause bleeding.  Ask your health care provider about taking iron pills and increasing the amount of dark green, leafy vegetables in your diet. These actions can help to boost your blood iron levels, which may be affected by heavy menstrual bleeding.  Pay close attention to your period and tell your health care provider about any changes, such as:  Increased blood flow that requires you to use more pads or tampons than usual per month.  A change in the number of days that your period lasts per month.  A change in symptoms that are associated with your period, such as abdominal cramping or back pain. SEEK MEDICAL CARE IF:  You have pelvic pain, back pain, or abdominal cramps that cannot be controlled with medicines.  You have an increase in bleeding between and during periods.  You soak tampons or pads in a half hour or less.  You feel lightheaded, extra tired, or weak. SEEK IMMEDIATE MEDICAL CARE IF:  You faint.  You have a sudden increase in pelvic  pain.   This information is not intended to replace advice given to you by your health care provider. Make sure you discuss any questions you have with your health care provider.   Document Released: 10/12/2000 Document Revised: 11/05/2014 Document Reviewed: 04/13/2014 Elsevier Interactive Patient Education [employer].

## 2016-06-02 NOTE — Progress Notes (Signed)
Dr Helane Rima notified of pt's admission and status. Will discuss with Dr Baron Sane. No orders currently

## 2016-06-02 NOTE — Progress Notes (Signed)
Sterile spec exam and FFN collected

## 2016-06-04 LAB — GC/CHLAMYDIA PROBE AMP (~~LOC~~) NOT AT ARMC
CHLAMYDIA, DNA PROBE: NEGATIVE
Neisseria Gonorrhea: NEGATIVE

## 2016-06-06 LAB — CULTURE, OB URINE

## 2016-06-07 ENCOUNTER — Other Ambulatory Visit: Payer: Self-pay | Admitting: Advanced Practice Midwife

## 2016-06-07 DIAGNOSIS — O2342 Unspecified infection of urinary tract in pregnancy, second trimester: Secondary | ICD-10-CM

## 2016-06-07 MED ORDER — AMPICILLIN 500 MG PO CAPS: 500.0000 mg | ORAL_CAPSULE | Freq: Four times a day (QID) | ORAL | 0 refills | Status: AC

## 2016-06-07 NOTE — Progress Notes (Unsigned)
Urine Culture not sensitive to Macrobid. Change to Ampicillin. Rx sent to pharmacy.

## 2016-06-08 NOTE — Progress Notes (Signed)
I attempted to call patient but voicemail was to full to leave a message.

## 2016-06-26 NOTE — Progress Notes (Signed)
I have called the pharmacy patient never pick medication up. They have placed it on hold. I attempt to call patient but there was no answer or voicemail to leave.

## 2016-06-29 ENCOUNTER — Encounter (HOSPITAL_COMMUNITY): Payer: Self-pay

## 2016-06-29 DIAGNOSIS — D65 Disseminated intravascular coagulation [defibrination syndrome]: Secondary | ICD-10-CM

## 2016-06-29 DIAGNOSIS — D759 Disease of blood and blood-forming organs, unspecified: Secondary | ICD-10-CM

## 2016-06-29 HISTORY — DX: Disseminated intravascular coagulation (defibrination syndrome): D65

## 2016-06-29 HISTORY — DX: Disease of blood and blood-forming organs, unspecified: D75.9

## 2016-07-03 ENCOUNTER — Encounter (HOSPITAL_COMMUNITY): Payer: Self-pay

## 2017-01-10 NOTE — Patient Instructions (Addendum)
Your procedure is scheduled on: Friday January 18, 2017 at 8:30 am  Enter through the [employer] [facility] at: 7:00 AM  Pick up the phone at the desk and dial [phone].  Call this number if you have problems the morning of surgery: [phone].  Remember: Do NOT eat food or drink any fluids after: Midnight on Thursday January 17, 2017  Take these medicines the morning of surgery with a SIP OF WATER: none  Do NOT wear jewelry (body piercing), metal hair clips/bobby pins, make-up, or nail polish. Do NOT wear lotions, powders, or perfumes.  You may wear deoderant. Do NOT shave for 48 hours prior to surgery. Do NOT bring valuables to the hospital. Contacts, dentures, or bridgework may not be worn into surgery. Have a responsible adult drive you home and stay with you for 24 hours after your procedure

## 2017-01-11 ENCOUNTER — Encounter (HOSPITAL_COMMUNITY): Payer: Self-pay

## 2017-01-11 ENCOUNTER — Encounter (HOSPITAL_COMMUNITY)
Admission: RE | Admit: 2017-01-11 | Discharge: 2017-01-11 | Disposition: A | Discharge status: Home / Self Care | Payer: 59 | Source: Ambulatory Visit | Attending: Obstetrics and Gynecology | Admitting: Obstetrics and Gynecology

## 2017-01-11 DIAGNOSIS — G510 Bell's palsy: Secondary | ICD-10-CM | POA: Insufficient documentation

## 2017-01-11 DIAGNOSIS — D759 Disease of blood and blood-forming organs, unspecified: Secondary | ICD-10-CM | POA: Insufficient documentation

## 2017-01-11 DIAGNOSIS — Z01812 Encounter for preprocedural laboratory examination: Secondary | ICD-10-CM | POA: Insufficient documentation

## 2017-01-11 DIAGNOSIS — D65 Disseminated intravascular coagulation [defibrination syndrome]: Secondary | ICD-10-CM | POA: Diagnosis not present

## 2017-01-11 HISTORY — DX: Disseminated intravascular coagulation (defibrination syndrome): D65

## 2017-01-11 HISTORY — DX: Disease of blood and blood-forming organs, unspecified: D75.9

## 2017-01-11 LAB — COMPREHENSIVE METABOLIC PANEL
ALBUMIN: 4 g/dL (ref 3.5–5.0)
ALT: 10 U/L — AB (ref 14–54)
AST: 16 U/L (ref 15–41)
Alkaline Phosphatase: 56 U/L (ref 38–126)
Anion gap: 6 (ref 5–15)
BUN: 13 mg/dL (ref 6–20)
CHLORIDE: 106 mmol/L (ref 101–111)
CO2: 25 mmol/L (ref 22–32)
CREATININE: 0.82 mg/dL (ref 0.44–1.00)
Calcium: 9 mg/dL (ref 8.9–10.3)
GFR calc Af Amer: >60 mL/min (ref >60)
GFR calc non Af Amer: >60 mL/min (ref >60)
GLUCOSE: 91 mg/dL (ref 65–99)
POTASSIUM: 3.7 mmol/L (ref 3.5–5.1)
Sodium: 137 mmol/L (ref 135–145)
Total Bilirubin: 0.4 mg/dL (ref 0.3–1.2)
Total Protein: 7.1 g/dL (ref 6.5–8.1)

## 2017-01-11 LAB — CBC
HCT: 39.9 % (ref 36.0–46.0)
Hemoglobin: 13 g/dL (ref 12.0–15.0)
MCH: 29 pg (ref 26.0–34.0)
MCHC: 32.6 g/dL (ref 30.0–36.0)
MCV: 89.1 fL (ref 78.0–100.0)
PLATELETS: 228 10*3/uL (ref 150–400)
RBC: 4.48 MIL/uL (ref 3.87–5.11)
RDW: 15 % (ref 11.5–15.5)
WBC: 5.4 10*3/uL (ref 4.0–10.5)

## 2017-01-11 LAB — PROTIME-INR
INR: 1
Prothrombin Time: 13.2 seconds (ref 11.4–15.2)

## 2017-01-11 LAB — TYPE AND SCREEN
ABO/RH(D): B NEG
ANTIBODY SCREEN: NEGATIVE

## 2017-01-11 LAB — ABO/RH: ABO/RH(D): B NEG

## 2017-01-11 LAB — APTT: APTT: 30 s (ref 24–36)

## 2017-01-11 NOTE — H&P (Signed)
Monica Yu is an 41 y.o. female [phone] presenting for scheduled tubal ligation surgery. Pt has completed childbearing and due to recent DIC is understandably reluctant to use hormonal contraception. Option of Paraguard was reviewed in light of fibroids- risks/benefits discussed. Pt opts for permanent sterilization via bilateral tubal ligation. All questions answered   Pertinent Gynecological History: Menses: flow is moderate Bleeding: normal monthly menses Contraception: vaginal spermicide DES exposure: denies Blood transfusions: recent DIC - received blood Sexually transmitted diseases: no past history Previous GYN Procedures: DNC  Last mammogram: normal Date: 12/24/16 Last pap: normal Date: 12/24/2016 OB History: G6, P3033   Menstrual History: Menarche age: 83 Patient's last menstrual period was 01/17/2016.    Past Medical History:  Diagnosis Date  . Bell's palsy    1997  . Medical history non-contributory     Past Surgical History:  Procedure Laterality Date  . CESAREAN SECTION     [id number]  . ESOPHAGOGASTRODUODENOSCOPY  08/13/2012   Procedure: ESOPHAGOGASTRODUODENOSCOPY (EGD);  Surgeon: Wonda Horner, MD;  Location: [facility];  Service: Endoscopy;  Laterality: N/A;  need xray  . SAVORY DILATION  08/13/2012   Procedure: SAVORY DILATION;  Surgeon: Wonda Horner, MD;  Location: [facility];  Service: Endoscopy;  Laterality: N/A;  . TONSILLECTOMY     1995    No family history on file.  Social History:  reports that she has never smoked. She has never used smokeless tobacco. She reports that she drinks alcohol. She reports that she does not use drugs.  Allergies: No Known Allergies  No prescriptions prior to admission.    Review of Systems  Constitutional: Negative for chills, fever, malaise/fatigue and weight loss.  Eyes: Negative for blurred vision.  Respiratory: Negative for shortness of breath.   Cardiovascular: Negative for chest pain.   Gastrointestinal: Negative for abdominal pain, heartburn, nausea and vomiting.  Genitourinary: Negative for dysuria.  Musculoskeletal: Negative for myalgias.  Skin: Negative for itching and rash.  Neurological: Negative for dizziness and headaches.  Endo/Heme/Allergies: Does not bruise/bleed easily.  Psychiatric/Behavioral: Negative for depression, hallucinations, substance abuse and suicidal ideas. The patient is not nervous/anxious.     Last menstrual period 01/17/2016. Physical Exam  Constitutional: She is oriented to person, place, and time. She appears well-nourished.  Eyes: Pupils are equal, round, and reactive to light.  Neck: Normal range of motion.  Cardiovascular: Normal rate.   Respiratory: Effort normal.  GI: Soft.  Genitourinary: Vagina normal.  Genitourinary Comments: Known Fibroid uterus  Musculoskeletal: Normal range of motion.  Neurological: She is alert and oriented to person, place, and time.  Skin: Skin is warm.  Psychiatric: She has a normal mood and affect. Her behavior is normal. Judgment and thought content normal.    No results found for this or any previous visit (from the past 24 hour(s)).  No results found.  Assessment/Plan: [id number] with complete family staus desiring permanent sterilization R/B/A reviewed All questions answered  TO OR when ready for laparoscopic bilateral tubal ligation, possible open  [place] 01/11/2017, 9:19 AM

## 2017-01-18 ENCOUNTER — Ambulatory Visit (HOSPITAL_COMMUNITY): Payer: 59 | Admitting: Anesthesiology

## 2017-01-18 ENCOUNTER — Encounter (HOSPITAL_COMMUNITY): Payer: Self-pay | Admitting: *Deleted

## 2017-01-18 ENCOUNTER — Ambulatory Visit (HOSPITAL_COMMUNITY)
Admission: RE | Admit: 2017-01-18 | Discharge: 2017-01-18 | Disposition: A | Discharge status: Home / Self Care | Payer: 59 | Source: Ambulatory Visit | Attending: Obstetrics and Gynecology | Admitting: Obstetrics and Gynecology

## 2017-01-18 ENCOUNTER — Encounter (HOSPITAL_COMMUNITY)
Admission: RE | Disposition: A | Discharge status: Home / Self Care | Payer: Self-pay | Source: Ambulatory Visit | Attending: Obstetrics and Gynecology

## 2017-01-18 DIAGNOSIS — Z6841 Body Mass Index (BMI) 40.0 and over, adult: Secondary | ICD-10-CM | POA: Diagnosis not present

## 2017-01-18 DIAGNOSIS — D259 Leiomyoma of uterus, unspecified: Secondary | ICD-10-CM | POA: Diagnosis not present

## 2017-01-18 DIAGNOSIS — Z9851 Tubal ligation status: Secondary | ICD-10-CM

## 2017-01-18 DIAGNOSIS — Z302 Encounter for sterilization: Secondary | ICD-10-CM | POA: Insufficient documentation

## 2017-01-18 HISTORY — PX: LAPAROSCOPIC TUBAL LIGATION: SHX1937

## 2017-01-18 LAB — PREGNANCY, URINE: Preg Test, Ur: NEGATIVE

## 2017-01-18 SURGERY — LIGATION, FALLOPIAN TUBE, LAPAROSCOPIC
Anesthesia: General | Site: Abdomen | Laterality: Bilateral

## 2017-01-18 MED ORDER — SOD CITRATE-CITRIC ACID 500-334 MG/5ML PO SOLN
30.0000 mL | ORAL | Status: AC
  Administered 2017-01-18: 30 mL via ORAL

## 2017-01-18 MED ORDER — OXYCODONE HCL 5 MG PO TABS: 5.0000 mg | ORAL_TABLET | Freq: Once | ORAL | Status: DC | PRN

## 2017-01-18 MED ORDER — HYDROMORPHONE HCL 1 MG/ML IJ SOLN
0.2500 mg | INTRAMUSCULAR | Status: DC | PRN
  Administered 2017-01-18: 0.5 mg via INTRAVENOUS

## 2017-01-18 MED ORDER — GLYCOPYRROLATE 0.2 MG/ML IJ SOLN
INTRAMUSCULAR | Status: AC
  Filled 2017-01-18: qty 1

## 2017-01-18 MED ORDER — MIDAZOLAM HCL 2 MG/2ML IJ SOLN
INTRAMUSCULAR | Status: DC | PRN
  Administered 2017-01-18: 2 mg via INTRAVENOUS

## 2017-01-18 MED ORDER — EPHEDRINE SULFATE 50 MG/ML IJ SOLN
INTRAMUSCULAR | Status: DC | PRN
  Administered 2017-01-18: 5 mg via INTRAVENOUS

## 2017-01-18 MED ORDER — EPHEDRINE 5 MG/ML INJ
INTRAVENOUS | Status: AC
  Filled 2017-01-18: qty 10

## 2017-01-18 MED ORDER — OXYCODONE HCL 5 MG/5ML PO SOLN: 5.0000 mg | Freq: Once | ORAL | Status: DC | PRN

## 2017-01-18 MED ORDER — ONDANSETRON HCL 4 MG/2ML IJ SOLN
INTRAMUSCULAR | Status: DC | PRN
  Administered 2017-01-18: 4 mg via INTRAVENOUS

## 2017-01-18 MED ORDER — HYDROMORPHONE HCL 1 MG/ML IJ SOLN
INTRAMUSCULAR | Status: AC
  Filled 2017-01-18: qty 1

## 2017-01-18 MED ORDER — OXYCODONE-ACETAMINOPHEN 5-325 MG PO TABS: 1.0000 | ORAL_TABLET | ORAL | 0 refills | Status: AC | PRN

## 2017-01-18 MED ORDER — SUGAMMADEX SODIUM 200 MG/2ML IV SOLN
INTRAVENOUS | Status: DC | PRN
  Administered 2017-01-18: 200 mg via INTRAVENOUS

## 2017-01-18 MED ORDER — FENTANYL CITRATE (PF) 250 MCG/5ML IJ SOLN
INTRAMUSCULAR | Status: AC
  Filled 2017-01-18: qty 5

## 2017-01-18 MED ORDER — DEXAMETHASONE SODIUM PHOSPHATE 10 MG/ML IJ SOLN
INTRAMUSCULAR | Status: DC | PRN
  Administered 2017-01-18: 10 mg via INTRAVENOUS

## 2017-01-18 MED ORDER — SUGAMMADEX SODIUM 200 MG/2ML IV SOLN
INTRAVENOUS | Status: AC
  Filled 2017-01-18: qty 2

## 2017-01-18 MED ORDER — BUPIVACAINE HCL (PF) 0.5 % IJ SOLN
INTRAMUSCULAR | Status: AC
  Filled 2017-01-18: qty 30

## 2017-01-18 MED ORDER — MIDAZOLAM HCL 2 MG/2ML IJ SOLN
INTRAMUSCULAR | Status: AC
  Filled 2017-01-18: qty 2

## 2017-01-18 MED ORDER — KETOROLAC TROMETHAMINE 30 MG/ML IJ SOLN: 30.0000 mg | Freq: Once | INTRAMUSCULAR | Status: DC | PRN

## 2017-01-18 MED ORDER — MEPERIDINE HCL 25 MG/ML IJ SOLN: 6.2500 mg | INTRAMUSCULAR | Status: DC | PRN

## 2017-01-18 MED ORDER — PROPOFOL 10 MG/ML IV BOLUS
INTRAVENOUS | Status: DC | PRN
  Administered 2017-01-18: 150 mg via INTRAVENOUS

## 2017-01-18 MED ORDER — BUPIVACAINE HCL (PF) 0.5 % IJ SOLN
INTRAMUSCULAR | Status: DC | PRN
  Administered 2017-01-18: 10 mL

## 2017-01-18 MED ORDER — SCOPOLAMINE 1 MG/3DAYS TD PT72
MEDICATED_PATCH | TRANSDERMAL | Status: AC
  Administered 2017-01-18: 1.5 mg via TRANSDERMAL
  Filled 2017-01-18: qty 1

## 2017-01-18 MED ORDER — LIDOCAINE HCL (CARDIAC) 20 MG/ML IV SOLN
INTRAVENOUS | Status: DC | PRN
  Administered 2017-01-18: 100 mg via INTRAVENOUS

## 2017-01-18 MED ORDER — ROCURONIUM BROMIDE 100 MG/10ML IV SOLN
INTRAVENOUS | Status: DC | PRN
  Administered 2017-01-18: 50 mg via INTRAVENOUS
  Administered 2017-01-18: 5 mg via INTRAVENOUS

## 2017-01-18 MED ORDER — METOCLOPRAMIDE HCL 5 MG/ML IJ SOLN: 10.0000 mg | Freq: Once | INTRAMUSCULAR | Status: DC | PRN

## 2017-01-18 MED ORDER — LACTATED RINGERS IV SOLN
INTRAVENOUS | Status: DC
  Administered 2017-01-18: 1000 mL via INTRAVENOUS
  Administered 2017-01-18 (×3): via INTRAVENOUS

## 2017-01-18 MED ORDER — FENTANYL CITRATE (PF) 100 MCG/2ML IJ SOLN
INTRAMUSCULAR | Status: DC | PRN
  Administered 2017-01-18 (×4): 50 ug via INTRAVENOUS

## 2017-01-18 MED ORDER — SCOPOLAMINE 1 MG/3DAYS TD PT72
1.0000 | MEDICATED_PATCH | Freq: Once | TRANSDERMAL | Status: DC
  Administered 2017-01-18: 1.5 mg via TRANSDERMAL

## 2017-01-18 MED ORDER — GLYCOPYRROLATE 0.2 MG/ML IJ SOLN
INTRAMUSCULAR | Status: DC | PRN
  Administered 2017-01-18: 0.2 mg via INTRAVENOUS

## 2017-01-18 MED ORDER — SOD CITRATE-CITRIC ACID 500-334 MG/5ML PO SOLN
ORAL | Status: AC
  Administered 2017-01-18: 30 mL via ORAL
  Filled 2017-01-18: qty 15

## 2017-01-18 SURGICAL SUPPLY — 26 items
CATH ROBINSON RED A/P 16FR (CATHETERS) ×3 IMPLANT
CLOTH BEACON ORANGE TIMEOUT ST (SAFETY) ×3 IMPLANT
DERMABOND ADVANCED (GAUZE/BANDAGES/DRESSINGS) ×2
DERMABOND ADVANCED .7 DNX12 (GAUZE/BANDAGES/DRESSINGS) ×1 IMPLANT
DRSG OPSITE POSTOP 3X4 (GAUZE/BANDAGES/DRESSINGS) IMPLANT
DURAPREP 26ML APPLICATOR (WOUND CARE) ×3 IMPLANT
FORCEPS CUTTING 33CM 5MM (CUTTING FORCEPS) ×3 IMPLANT
GLOVE BIO SURGEON STRL SZ 6.5 (GLOVE) ×2 IMPLANT
GLOVE BIO SURGEONS STRL SZ 6.5 (GLOVE) ×1
GLOVE BIOGEL PI IND STRL 7.0 (GLOVE) ×1 IMPLANT
GLOVE BIOGEL PI INDICATOR 7.0 (GLOVE) ×2
GOWN STRL REUS W/TWL LRG LVL3 (GOWN DISPOSABLE) ×6 IMPLANT
PACK LAPAROSCOPY BASIN (CUSTOM PROCEDURE TRAY) ×3 IMPLANT
PACK TRENDGUARD 450 HYBRID PRO (MISCELLANEOUS) ×1 IMPLANT
PACK TRENDGUARD 600 HYBRD PROC (MISCELLANEOUS) IMPLANT
PROTECTOR NERVE ULNAR (MISCELLANEOUS) ×6 IMPLANT
SLEEVE XCEL OPT CAN 5 100 (ENDOMECHANICALS) ×3 IMPLANT
SUT VICRYL 0 UR6 27IN ABS (SUTURE) ×3 IMPLANT
SUT VICRYL 4-0 PS2 18IN ABS (SUTURE) IMPLANT
TOWEL OR 17X24 6PK STRL BLUE (TOWEL DISPOSABLE) ×6 IMPLANT
TRENDGUARD 450 HYBRID PRO PACK (MISCELLANEOUS) ×3
TRENDGUARD 600 HYBRID PROC PK (MISCELLANEOUS)
TROCAR 5M 150ML BLDLS (TROCAR) ×3 IMPLANT
TROCAR XCEL NON-BLD 5MMX100MML (ENDOMECHANICALS) ×3 IMPLANT
WARMER LAPAROSCOPE (MISCELLANEOUS) ×3 IMPLANT
WATER STERILE IRR 1000ML POUR (IV SOLUTION) ×3 IMPLANT

## 2017-01-18 NOTE — Op Note (Signed)
Operative Note    Preoperative Diagnosis: Complete family status; desires permanent sterilization   Postoperative Diagnosis: same   Procedure: Laparoscopic bilateral tubal ligation   Surgeon: C.Dazaria Macneill DO Assist: K. Marvel Plan DO  Anesthesia: General  Fluids: LR EBL: <33ml UOP: red rubber <11ml  Findings: Enlarged uterus due to multiple fibroids encompassing most of pelvis; up to umbilicus Diffuse mild adhesions   Specimen: none   Procedure Note Patient was taken to the operating room where general anesthesia was obtained without difficulty. She was then prepped and draped in the normal sterile fashion in the dorsal lithotomy position. An appropriate timeout was performed. A speculum was then placed within the vagina and a Hulka tenaculum placed within the cervix for uterine manipulation. The bladder was emptied. Attention was then turned to the patient's abdomen after draping where the infraumbilicus was incised and a 9mm scope placed under direct visualization. Gas flow was then applied and a pneumoperitoneum obtained with approximate 3 L of CO2 gas. With patient in Trendelenburg the uterus and tubes and ovaries were inspected with findings as stated above. Two more 7mm ports were placedunder direct visualization the lleft and right lower quadrants. Both fallopian tubes were identified and followed to their fimbriated ends. No gross abnormalities noted.  A gyrus cautery was then introduced through the operative scope and the left fallopian tube grasped approximately 3 cm from the uterine cornua and cauterized sequentially for 2-3cm with good blanching noted. Attention was then turned to the right fallopian tube which was likewise grasped proximally 3 cm from the uterine cornua and similarly cauterized. The remainder of the pelvis and abdomen were inspected and found to be as stated above. The patient was then flattened and all instruments were removed from the abdomen and the  pneumoperitoneum reduced through the operative trocar. The trocar was finally removed and the infraumbilical incision closed with 4-0 Vicryl and dermabond. The suprapubic incision was also closed with 4-0 vicryl and a bandage. The hulka tenaculum was removed- hemostasis noted. Patient was then awakened and taken to the recovery room in good condition. Counts were correct

## 2017-01-18 NOTE — Anesthesia Preprocedure Evaluation (Signed)
Anesthesia Evaluation  Patient identified by MRN, date of birth, ID band Patient awake    Reviewed: Allergy & Precautions, NPO status , Patient's Chart, lab work & pertinent test results  Airway Mallampati: II  TM Distance: >3 FB Neck ROM: Full    Dental no notable dental hx.    Pulmonary neg pulmonary ROS,    Pulmonary exam normal breath sounds clear to auscultation       Cardiovascular negative cardio ROS Normal cardiovascular exam Rhythm:Regular Rate:Normal     Neuro/Psych negative neurological ROS  negative psych ROS   GI/Hepatic negative GI ROS, Neg liver ROS,   Endo/Other  negative endocrine ROSMorbid obesity  Renal/GU negative Renal ROS  negative genitourinary   Musculoskeletal negative musculoskeletal ROS (+)   Abdominal   Peds negative pediatric ROS (+)  Hematology negative hematology ROS (+)   Anesthesia Other Findings   Reproductive/Obstetrics negative OB ROS                             Anesthesia Physical Anesthesia Plan  ASA: III  Anesthesia Plan: General   Post-op Pain Management:    Induction: Intravenous  Airway Management Planned: Oral ETT  Additional Equipment:   Intra-op Plan:   Post-operative Plan: Extubation in OR  Informed Consent: I have reviewed the patients History and Physical, chart, labs and discussed the procedure including the risks, benefits and alternatives for the proposed anesthesia with the patient or authorized representative who has indicated his/her understanding and acceptance.   Dental advisory given  Plan Discussed with: CRNA  Anesthesia Plan Comments:         Anesthesia Quick Evaluation

## 2017-01-18 NOTE — Anesthesia Postprocedure Evaluation (Signed)
Anesthesia Post Note  Patient: Monica Yu  Procedure(s) Performed: Procedure(s) (LRB): LAPAROSCOPIC TUBAL LIGATION (Bilateral)  Patient location during evaluation: PACU Anesthesia Type: General Level of consciousness: awake and alert Pain management: pain level controlled Vital Signs Assessment: post-procedure vital signs reviewed and stable Respiratory status: spontaneous breathing, nonlabored ventilation and respiratory function stable Cardiovascular status: blood pressure returned to baseline and stable Postop Assessment: no signs of nausea or vomiting Anesthetic complications: no        Last Vitals:  Vitals:   01/18/17 1030 01/18/17 1045  BP: 117/74   Pulse: 64 70  Resp: (!) 21 17  Temp:      Last Pain:  Vitals:   01/18/17 1045  TempSrc:   PainSc: 3    Pain Goal: Patients Stated Pain Goal: 3 (01/18/17 1045)               Lynda Rainwater

## 2017-01-18 NOTE — Discharge Instructions (Addendum)
Nothing in vagina for 6 weeks.  No sex, tampons, and douching.  No heavy lifting till post op visit  DISCHARGE INSTRUCTIONS: Laparoscopy  The following instructions have been prepared to help you care for yourself upon your return home today.  Wound care:  Do not get the incision wet for the first 24 hours. The incision should be kept clean and dry.  The Band-Aids or dressings may be removed the day after surgery.  Should the incision become sore, red, and swollen after the first week, check with your doctor.  Personal hygiene:  Shower the day after your procedure.  Activity and limitations:  Do NOT drive or operate any equipment today.  Do NOT lift anything more than 15 pounds for 2-3 weeks after surgery.  Do NOT rest in bed all day.  Walking is encouraged. Walk each day, starting slowly with 5-minute walks 3 or 4 times a day. Slowly increase the length of your walks.  Walk up and down stairs slowly.  Do NOT do strenuous activities, such as golfing, playing tennis, bowling, running, biking, weight lifting, gardening, mowing, or vacuuming for 2-4 weeks. Ask your doctor when it is okay to start.  Diet: Eat a light meal as desired this evening. You may resume your usual diet tomorrow.  Return to work: This is dependent on the type of work you do. For the most part you can return to a desk job within a week of surgery. If you are more active at work, please discuss this with your doctor.  What to expect after your surgery: You may have a slight burning sensation when you urinate on the first day. You may have a very small amount of blood in the urine. Expect to have a small amount of vaginal discharge/light bleeding for 1-2 weeks. It is not unusual to have abdominal soreness and bruising for up to 2 weeks. You may be tired and need more rest for about 1 week. You may experience shoulder pain for 24-72 hours. Lying flat in bed may relieve it.  Call your doctor for any of the  following:  Develop a fever of 100.4 or greater  Inability to urinate 6 hours after discharge from hospital  Severe pain not relieved by pain medications  Persistent of heavy bleeding at incision site  Redness or swelling around incision site after a week  Increasing nausea or vomiting  Patient Signature________________________________________ Nurse Signature_________________________________________  Post Anesthesia Home Care Instructions  Activity: Get plenty of rest for the remainder of the day. A responsible individual must stay with you for 24 hours following the procedure.  For the next 24 hours, DO NOT: -Drive a car -Paediatric nurse -Drink alcoholic beverages -Take any medication unless instructed by your physician -Make any legal decisions or sign important papers.  Meals: Start with liquid foods such as gelatin or soup. Progress to regular foods as tolerated. Avoid greasy, spicy, heavy foods. If nausea and/or vomiting occur, drink only clear liquids until the nausea and/or vomiting subsides. Call your physician if vomiting continues.  Special Instructions/Symptoms: Your throat may feel dry or sore from the anesthesia or the breathing tube placed in your throat during surgery. If this causes discomfort, gargle with warm salt water. The discomfort should disappear within 24 hours.  If you had a scopolamine patch placed behind your ear for the management of post- operative nausea and/or vomiting:  1. The medication in the patch is effective for 72 hours, after which it should be removed.  Wrap  patch in a tissue and discard in the trash. Wash hands thoroughly with soap and water. 2. You may remove the patch earlier than 72 hours if you experience unpleasant side effects which may include dry mouth, dizziness or visual disturbances. 3. Avoid touching the patch. Wash your hands with soap and water after contact with the patch.

## 2017-01-18 NOTE — Anesthesia Procedure Notes (Signed)
Procedure Name: Intubation Date/Time: 01/18/2017 8:38 AM Performed by: Georgeanne Nim Pre-anesthesia Checklist: Patient identified, Patient being monitored, Timeout performed, Emergency Drugs available and Suction available Patient Re-evaluated:Patient Re-evaluated prior to inductionOxygen Delivery Method: Circle system utilized Preoxygenation: Pre-oxygenation with 100% oxygen Intubation Type: IV induction Ventilation: Mask ventilation without difficulty Laryngoscope Size: Mac and 3 Grade View: Grade I Tube size: 7.0 mm Number of attempts: 1 Airway Equipment and Method: Stylet Placement Confirmation: ETT inserted through vocal cords under direct vision,  breath sounds checked- equal and bilateral and positive ETCO2 Secured at: 20 cm Tube secured with: Tape Dental Injury: Teeth and Oropharynx as per pre-operative assessment

## 2017-01-18 NOTE — Interval H&P Note (Signed)
History and Physical Interval Note:  01/18/2017 8:20 AM  Monica Yu  has presented today for surgery, with the diagnosis of sterilization, hx of extensive abdominal surgery and DIC  The various methods of treatment have been discussed with the patient and family. After consideration of risks, benefits and other options for treatment, the patient has consented to  Procedure(s): LAPAROSCOPIC TUBAL LIGATION (Bilateral) as a surgical intervention .  The patient's history has been reviewed, patient examined, no change in status, stable for surgery.  I have reviewed the patient's chart and labs.  Questions were answered to the patient's satisfaction.     [place]

## 2017-01-18 NOTE — Transfer of Care (Signed)
Immediate Anesthesia Transfer of Care Note  Patient: Monica Yu  Procedure(s) Performed: Procedure(s): LAPAROSCOPIC TUBAL LIGATION (Bilateral)  Patient Location: PACU  Anesthesia Type:General  Level of Consciousness: awake, alert , oriented and patient cooperative  Airway & Oxygen Therapy: Patient Spontanous Breathing and Patient connected to nasal cannula oxygen  Post-op Assessment: Report given to RN and Post -op Vital signs reviewed and stable  Post vital signs: Reviewed and stable  Last Vitals:  Vitals:   01/18/17 0704  BP: 127/74  Pulse: 79  Resp: 18  Temp: 36.7 C    Last Pain:  Vitals:   01/18/17 0704  TempSrc: Oral  PainSc: 0-No pain      Patients Stated Pain Goal: 3 (06/77/03 4035)  Complications: No apparent anesthesia complications

## 2017-01-22 ENCOUNTER — Encounter (HOSPITAL_COMMUNITY): Payer: Self-pay | Admitting: Obstetrics and Gynecology

## 2017-04-07 ENCOUNTER — Encounter (HOSPITAL_COMMUNITY): Payer: Self-pay

## 2019-11-12 ENCOUNTER — Ambulatory Visit: Payer: Self-pay | Attending: Internal Medicine

## 2019-11-12 DIAGNOSIS — Z20822 Contact with and (suspected) exposure to covid-19: Secondary | ICD-10-CM | POA: Insufficient documentation

## 2019-11-13 LAB — NOVEL CORONAVIRUS, NAA: SARS-CoV-2, NAA: NOT DETECTED

## 2019-11-16 ENCOUNTER — Ambulatory Visit: Payer: BC Managed Care – PPO | Attending: Internal Medicine

## 2019-11-16 DIAGNOSIS — Z20822 Contact with and (suspected) exposure to covid-19: Secondary | ICD-10-CM | POA: Insufficient documentation

## 2019-11-17 LAB — NOVEL CORONAVIRUS, NAA: SARS-CoV-2, NAA: NOT DETECTED
